# Patient Record
Sex: Male | Born: 2000
Health system: Southern US, Community
[De-identification: ages and names within clinical notes are randomized; demographics above are authoritative.]

## PROBLEM LIST (undated history)

## (undated) DIAGNOSIS — J45909 Unspecified asthma, uncomplicated: Secondary | ICD-10-CM

## (undated) HISTORY — PX: NASAL FRACTURE SURGERY: SHX718

## (undated) HISTORY — PX: WISDOM TOOTH EXTRACTION: SHX21

## (undated) HISTORY — PX: FRENULECTOMY, UPPER LABIAL: SHX1683

---
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
## 2018-08-06 DIAGNOSIS — M545 Low back pain: Secondary | ICD-10-CM | POA: Diagnosis not present

## 2018-08-10 DIAGNOSIS — M545 Low back pain: Secondary | ICD-10-CM | POA: Diagnosis not present

## 2019-09-11 ENCOUNTER — Ambulatory Visit: Payer: 59 | Attending: Internal Medicine

## 2019-09-11 DIAGNOSIS — Z20822 Contact with and (suspected) exposure to covid-19: Secondary | ICD-10-CM

## 2019-09-12 LAB — NOVEL CORONAVIRUS, NAA: SARS-CoV-2, NAA: NOT DETECTED

## 2019-10-01 ENCOUNTER — Ambulatory Visit: Payer: 59 | Attending: Internal Medicine

## 2019-10-01 DIAGNOSIS — Z20822 Contact with and (suspected) exposure to covid-19: Secondary | ICD-10-CM

## 2019-10-02 LAB — NOVEL CORONAVIRUS, NAA: SARS-CoV-2, NAA: NOT DETECTED

## 2020-04-12 DIAGNOSIS — Z20828 Contact with and (suspected) exposure to other viral communicable diseases: Secondary | ICD-10-CM | POA: Diagnosis not present

## 2020-04-19 ENCOUNTER — Encounter: Payer: Self-pay | Admitting: Emergency Medicine

## 2020-04-19 ENCOUNTER — Other Ambulatory Visit: Payer: Self-pay

## 2020-04-19 ENCOUNTER — Ambulatory Visit (INDEPENDENT_AMBULATORY_CARE_PROVIDER_SITE_OTHER): Payer: 59

## 2020-04-19 ENCOUNTER — Ambulatory Visit
Admission: EM | Admit: 2020-04-19 | Discharge: 2020-04-19 | Disposition: A | Discharge status: Home / Self Care | Payer: 59 | Attending: Family Medicine | Admitting: Family Medicine

## 2020-04-19 DIAGNOSIS — J1282 Pneumonia due to coronavirus disease 2019: Secondary | ICD-10-CM

## 2020-04-19 DIAGNOSIS — R509 Fever, unspecified: Secondary | ICD-10-CM | POA: Diagnosis not present

## 2020-04-19 DIAGNOSIS — U071 COVID-19: Secondary | ICD-10-CM

## 2020-04-19 DIAGNOSIS — J189 Pneumonia, unspecified organism: Secondary | ICD-10-CM | POA: Diagnosis not present

## 2020-04-19 MED ORDER — ALBUTEROL SULFATE HFA 108 (90 BASE) MCG/ACT IN AERS: 1.0000 | INHALATION_SPRAY | Freq: Four times a day (QID) | RESPIRATORY_TRACT | 0 refills | Status: DC | PRN

## 2020-04-19 MED ORDER — PREDNISONE 50 MG PO TABS: ORAL_TABLET | ORAL | 0 refills | Status: DC

## 2020-04-19 MED ORDER — BENZONATATE 200 MG PO CAPS: 200.0000 mg | ORAL_CAPSULE | Freq: Three times a day (TID) | ORAL | 0 refills | Status: DC | PRN

## 2020-04-19 NOTE — ED Triage Notes (Signed)
Pt tested positive for covid about a week ago. He states his fever has come back about 3 days ago, he can hear crackles in his chest. He also has pain in his chest. He has known asthma. He is requesting a chest xray.

## 2020-04-19 NOTE — Discharge Instructions (Signed)
Rest.  Check Pulse Ox daily.  Medication as prescribed.  Take care  Dr. Adriana Simas

## 2020-04-19 NOTE — ED Provider Notes (Signed)
[facility]    CSN: [id number] Arrival date & time: 04/19/20  1118      History   Chief Complaint Chief Complaint  Patient presents with  . COVID POSTITIVE  . Fever   HPI  19 year old male presents with the above complaints.  Patient reports that he tested positive for Covid 1 week ago.  He has been doing okay until the past 3 days.  Has had fever, T-max 100.7.  Has also been experiencing severe cough.  Keeping him up at night.  Patient states that he hears rattles in his chest.  He is concerned he has pneumonia.  Mother has been giving him albuterol nebulizer treatments without resolution.  Oxygen saturation normal at this time.    Past Medical History:  Diagnosis Date  . Asthma    as a child   Past Surgical History:  Procedure Laterality Date  . NASAL FRACTURE SURGERY    . ORIF ULNAR FRACTURE Right 06/21/2017   Procedure: OPEN REDUCTION INTERNAL FIXATION (ORIF) proximal ulna;  Surgeon: Bradly Bienenstock, MD;  Location: [facility] OR;  Service: Orthopedics;  Laterality: Right;  90 mins  . WISDOM TOOTH EXTRACTION     Home Medications    Prior to Admission medications   Medication Sig Start Date End Date Taking? Authorizing Provider  acetaminophen (TYLENOL) 500 MG tablet Take 500-1,000 mg by mouth every 6 (six) hours as needed for moderate pain.    [provider]  albuterol (VENTOLIN HFA) 108 (90 Base) MCG/ACT inhaler Inhale 1-2 puffs into the lungs every 6 (six) hours as needed for wheezing or shortness of breath. 04/19/20   Tommie Sams, DO  benzonatate (TESSALON) 200 MG capsule Take 1 capsule (200 mg total) by mouth 3 (three) times daily as needed for cough. 04/19/20   Tommie Sams, DO  predniSONE (DELTASONE) 50 MG tablet 1 tablet daily x 5 days 04/19/20   Tommie Sams, DO    Family History Family History  Problem Relation Age of Onset  . Sarcoidosis Father     Social History Social History   Tobacco Use  . Smoking status: Never Smoker  . Smokeless  tobacco: Never Used  Vaping Use  . Vaping Use: Former  Substance Use Topics  . Alcohol use: No  . Drug use: No     Allergies   Bee venom and Strawberry (diagnostic)   Review of Systems Review of Systems  Constitutional: Positive for fever.  Respiratory: Positive for cough.    Physical Exam Triage Vital Signs ED Triage Vitals  Enc Vitals Group     BP 04/19/20 1205 128/82     Pulse Rate 04/19/20 1205 (!) 104     Resp 04/19/20 1205 18     Temp 04/19/20 1205 99.1 F (37.3 C)     Temp Source 04/19/20 1205 Oral     SpO2 04/19/20 1205 98 %     Weight 04/19/20 1202 250 lb (113.4 kg)     Height 04/19/20 1202 6\' 3"  (1.905 m)     Head Circumference --      Peak Flow --      Pain Score 04/19/20 1202 4     Pain Loc --      Pain Edu? --      Excl. in GC? --    Updated Vital Signs BP 128/82 (BP Location: Right Arm)   Pulse (!) 104   Temp 99.1 F (37.3 C) (Oral)   Resp 18   Ht 6'  3" (1.905 m)   Wt 113.4 kg   SpO2 98%   BMI 31.25 kg/m   Visual Acuity Right Eye Distance:   Left Eye Distance:   Bilateral Distance:    Right Eye Near:   Left Eye Near:    Bilateral Near:     Physical Exam Vitals and nursing note reviewed.  Constitutional:      General: He is not in acute distress.    Appearance: Normal appearance. He is not ill-appearing.  HENT:     Head: Normocephalic and atraumatic.  Eyes:     General:        Right eye: No discharge.        Left eye: No discharge.     Conjunctiva/sclera: Conjunctivae normal.  Cardiovascular:     Rate and Rhythm: Regular rhythm. Tachycardia present.  Pulmonary:     Effort: Pulmonary effort is normal.     Comments: Crackles noted. Neurological:     Mental Status: He is alert.  Psychiatric:        Mood and Affect: Mood normal.        Behavior: Behavior normal.     UC Treatments / Results  Labs (all labs ordered are listed, but only abnormal results are displayed) Labs Reviewed - No data to  display  EKG   Radiology DG Chest 2 View  Result Date: 04/19/2020 CLINICAL DATA:  COVID-19 positive, fever EXAM: CHEST - 2 VIEW COMPARISON:  None. FINDINGS: Normal heart size. Normal mediastinal contour. No pneumothorax. No pleural effusion. Faint patchy hazy opacities in the mid lungs bilaterally. IMPRESSION: Faint patchy hazy opacities in the mid lungs bilaterally, compatible with COVID-19 pneumonia. Electronically Signed   By: Delbert Phenix M.D.   On: 04/19/2020 12:33    Procedures Procedures (including critical care time)  Medications Ordered in UC Medications - No data to display  Initial Impression / Assessment and Plan / UC Course  I have reviewed the triage vital signs and the nursing notes.  Pertinent labs & imaging results that were available during my care of the patient were reviewed by me and considered in my medical decision making (see chart for details).    19 year old male presents with fever and persistent cough.  COVID-19 positive.  Chest x-ray obtained today and independently reviewed by me.  Bilateral patchy opacities consistent with COVID-19 pneumonia.  Patient does not qualify for infusion.  No indications for antibiotic therapy as this is quite consistent with COVID-19 pneumonia as opposed to a secondary bacterial infection.  Placing on albuterol, prednisone.  Tessalon Perles for cough.  Final Clinical Impressions(s) / UC Diagnoses   Final diagnoses:  Pneumonia due to COVID-19 virus     Discharge Instructions     Rest.  Check Pulse Ox daily.  Medication as prescribed.  Take care  Dr. Adriana Simas    ED Prescriptions    Medication Sig Dispense Auth. Provider   albuterol (VENTOLIN HFA) 108 (90 Base) MCG/ACT inhaler Inhale 1-2 puffs into the lungs every 6 (six) hours as needed for wheezing or shortness of breath. 18 g Saladin Petrelli G, DO   predniSONE (DELTASONE) 50 MG tablet 1 tablet daily x 5 days 5 tablet Namrata Dangler G, DO   benzonatate (TESSALON) 200 MG  capsule Take 1 capsule (200 mg total) by mouth 3 (three) times daily as needed for cough. 30 capsule Tommie Sams, DO     PDMP not reviewed this encounter.   Tommie Sams, [place] 04/19/20 1326

## 2021-06-19 IMAGING — CR DG CHEST 2V
3 series · 3 of 3 positions shown · non-contrast
Comparison: None.

CLINICAL DATA: PXCDB-T6 positive, fever

EXAM:
CHEST - 2 VIEW

[chest pa]
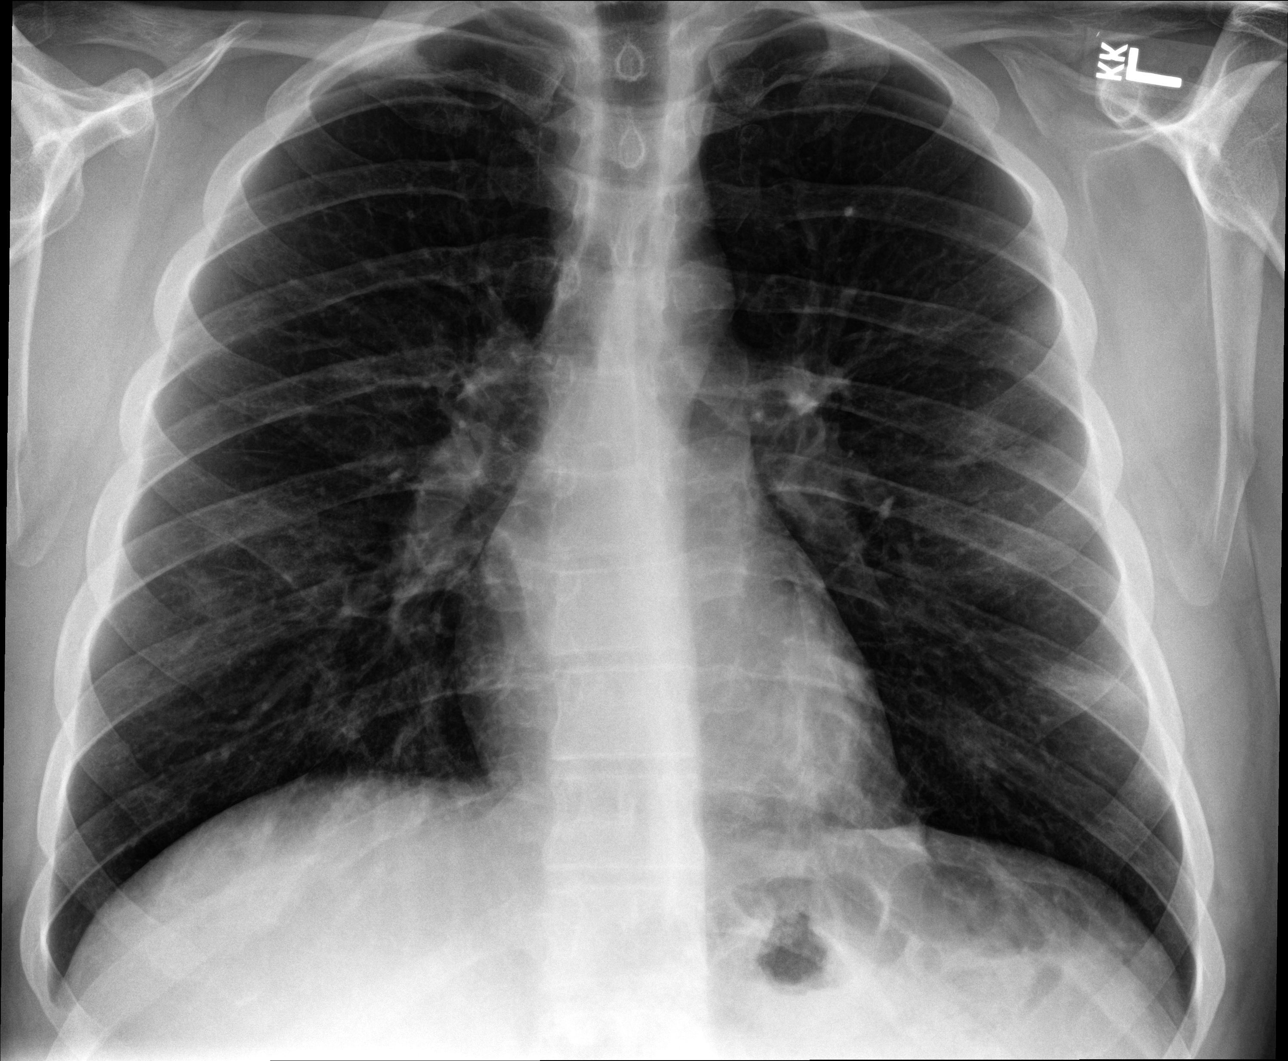

[chest lat (1 of 2)]
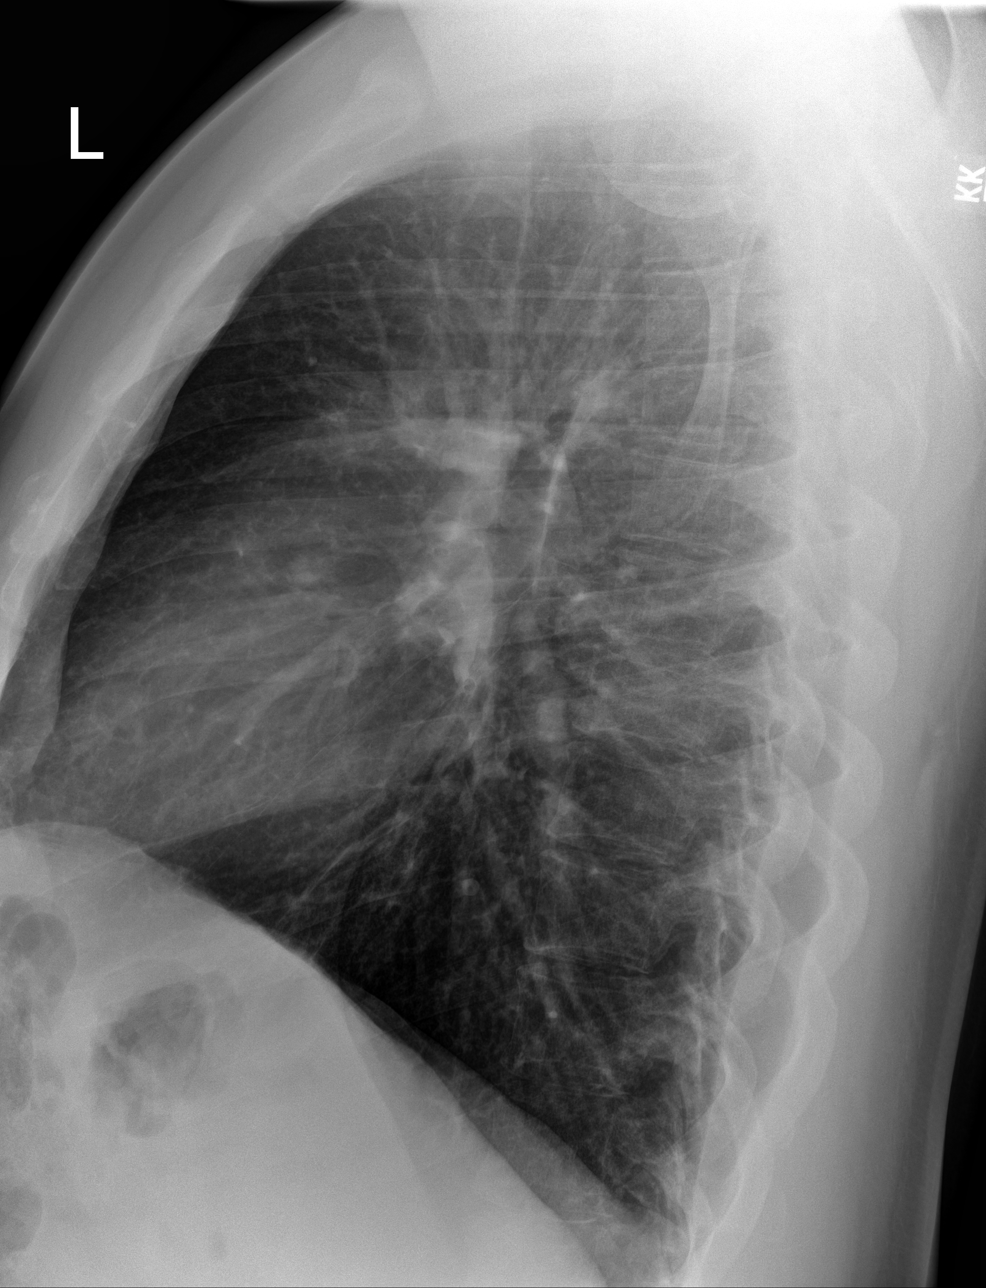

[chest lat (2 of 2)]
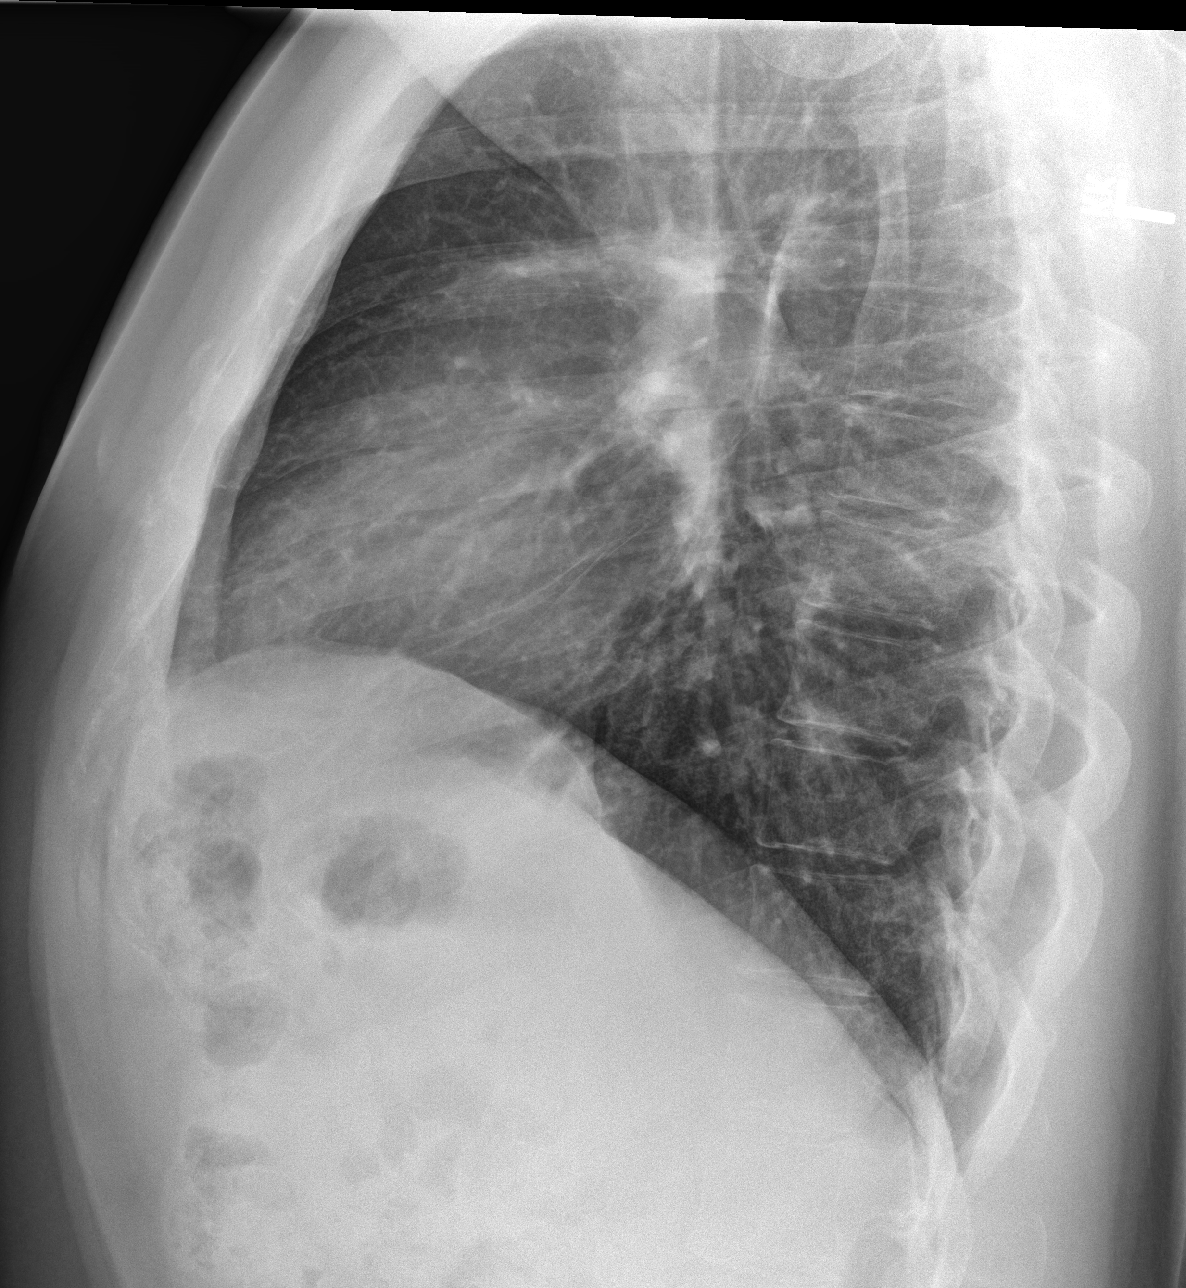

[3 of 3 positions shown; findings below may reference images not displayed]

FINDINGS: Normal heart size. Normal mediastinal contour. No pneumothorax. No
pleural effusion. Faint patchy hazy opacities in the mid lungs
bilaterally.
IMPRESSION: Faint patchy hazy opacities in the mid lungs bilaterally, compatible
with PXCDB-T6 pneumonia.

## 2022-03-07 ENCOUNTER — Other Ambulatory Visit (HOSPITAL_COMMUNITY): Payer: Self-pay

## 2022-03-07 ENCOUNTER — Telehealth: Payer: 59 | Admitting: Physician Assistant

## 2022-03-07 DIAGNOSIS — B9689 Other specified bacterial agents as the cause of diseases classified elsewhere: Secondary | ICD-10-CM

## 2022-03-07 DIAGNOSIS — B9789 Other viral agents as the cause of diseases classified elsewhere: Secondary | ICD-10-CM

## 2022-03-07 DIAGNOSIS — J208 Acute bronchitis due to other specified organisms: Secondary | ICD-10-CM

## 2022-03-07 MED ORDER — PSEUDOEPH-BROMPHEN-DM 30-2-10 MG/5ML PO SYRP
5.0000 mL | ORAL_SOLUTION | Freq: Four times a day (QID) | ORAL | 0 refills | Status: DC | PRN
  Filled 2022-03-07: qty 120, 6d supply, fill #0

## 2022-03-07 MED ORDER — IPRATROPIUM BROMIDE 0.03 % NA SOLN
2.0000 | Freq: Two times a day (BID) | NASAL | 0 refills | Status: DC
  Filled 2022-03-07: qty 30, 30d supply, fill #0

## 2022-03-07 NOTE — Progress Notes (Signed)
E-Visit for Sinus Problems  We are sorry that you are not feeling well.  Here is how we plan to help!  Based on what you have shared with me it looks like you have sinusitis.  Sinusitis is inflammation and infection in the sinus cavities of the head.  Based on your presentation I believe you most likely have Acute Viral Sinusitis.This is an infection most likely caused by a virus. There is not specific treatment for viral sinusitis other than to help you with the symptoms until the infection runs its course.  You may use an oral decongestant such as Mucinex D or if you have glaucoma or high blood pressure use plain Mucinex. Saline nasal spray help and can safely be used as often as needed for congestion, I have prescribed: Ipratropium Bromide nasal spray 0.03% 2 sprays in eah nostril 2-3 times a day. I will prescribe Bromfed DM cough syrup for the cough.   Some authorities believe that zinc sprays or the use of Echinacea may shorten the course of your symptoms.  Sinus infections are not as easily transmitted as other respiratory infection, however we still recommend that you avoid close contact with loved ones, especially the very young and elderly.  Remember to wash your hands thoroughly throughout the day as this is the number one way to prevent the spread of infection!  Home Care: Only take medications as instructed by your medical team. Do not take these medications with alcohol. A steam or ultrasonic humidifier can help congestion.  You can place a towel over your head and breathe in the steam from hot water coming from a faucet. Avoid close contacts especially the very young and the elderly. Cover your mouth when you cough or sneeze. Always remember to wash your hands.  Get Help Right Away If: You develop worsening fever or sinus pain. You develop a severe head ache or visual changes. Your symptoms persist after you have completed your treatment plan.  Make sure you Understand these  instructions. Will watch your condition. Will get help right away if you are not doing well or get worse.   Thank you for choosing an e-visit.  Your e-visit answers were reviewed by a board certified advanced clinical practitioner to complete your personal care plan. Depending upon the condition, your plan could have included both over the counter or prescription medications.  Please review your pharmacy choice. Make sure the pharmacy is open so you can pick up prescription now. If there is a problem, you may contact your provider through [employer] and have the prescription routed to another pharmacy.  Your safety is important to [place]. If you have drug allergies check your prescription carefully.   For the next 24 hours you can use MyChart to ask questions about today's visit, request a non-urgent call back, or ask for a work or school excuse. You will get an email in the next two days asking about your experience. I hope that your e-visit has been valuable and will speed your recovery.  I provided 5 minutes of non face-to-face time during this encounter for chart review and documentation.

## 2022-03-09 ENCOUNTER — Other Ambulatory Visit (HOSPITAL_COMMUNITY): Payer: Self-pay

## 2022-03-09 MED ORDER — AZITHROMYCIN 250 MG PO TABS
ORAL_TABLET | ORAL | 0 refills | Status: AC
  Filled 2022-03-09: qty 6, 5d supply, fill #0

## 2022-03-09 MED ORDER — BENZONATATE 100 MG PO CAPS
100.0000 mg | ORAL_CAPSULE | Freq: Three times a day (TID) | ORAL | 0 refills | Status: DC | PRN
  Filled 2022-03-09: qty 30, 10d supply, fill #0

## 2022-03-09 NOTE — Addendum Note (Signed)
Addended by: Margaretann Loveless on: 03/09/2022 07:35 AM   Modules accepted: Orders

## 2022-03-09 NOTE — Progress Notes (Signed)
We are sorry that you are not feeling well.  Here is how we plan to help!  Based on your presentation I believe you most likely have A cough due to bacteria.  When patients have a fever and a productive cough with a change in color or increased sputum production, we are concerned about bacterial bronchitis.  If left untreated it can progress to pneumonia.  If your symptoms do not improve with your treatment plan it is important that you contact your provider.   I have prescribed Azithromyin 250 mg: two tablets now and then one tablet daily for 4 additonal days    In addition you may use A non-prescription cough medication called Mucinex DM: take 2 tablets every 12 hours. and A prescription cough medication called Tessalon Perles 100mg. You may take 1-2 capsules every 8 hours as needed for your cough.   From your responses in the eVisit questionnaire you describe inflammation in the upper respiratory tract which is causing a significant cough.  This is commonly called Bronchitis and has four common causes:   Allergies Viral Infections Acid Reflux Bacterial Infection Allergies, viruses and acid reflux are treated by controlling symptoms or eliminating the cause. An example might be a cough caused by taking certain blood pressure medications. You stop the cough by changing the medication. Another example might be a cough caused by acid reflux. Controlling the reflux helps control the cough.  USE OF BRONCHODILATOR ("RESCUE") INHALERS: There is a risk from using your bronchodilator too frequently.  The risk is that over-reliance on a medication which only relaxes the muscles surrounding the breathing tubes can reduce the effectiveness of medications prescribed to reduce swelling and congestion of the tubes themselves.  Although you feel brief relief from the bronchodilator inhaler, your asthma may actually be worsening with the tubes becoming more swollen and filled with mucus.  This can delay other  crucial treatments, such as oral steroid medications. If you need to use a bronchodilator inhaler daily, several times per day, you should discuss this with your provider.  There are probably better treatments that could be used to keep your asthma under control.     HOME CARE Only take medications as instructed by your medical team. Complete the entire course of an antibiotic. Drink plenty of fluids and get plenty of rest. Avoid close contacts especially the very young and the elderly Cover your mouth if you cough or cough into your sleeve. Always remember to wash your hands A steam or ultrasonic humidifier can help congestion.   GET HELP RIGHT AWAY IF: You develop worsening fever. You become short of breath You cough up blood. Your symptoms persist after you have completed your treatment plan MAKE SURE YOU  Understand these instructions. Will watch your condition. Will get help right away if you are not doing well or get worse.    Thank you for choosing an e-visit.  Your e-visit answers were reviewed by a board certified advanced clinical practitioner to complete your personal care plan. Depending upon the condition, your plan could have included both over the counter or prescription medications.  Please review your pharmacy choice. Make sure the pharmacy is open so you can pick up prescription now. If there is a problem, you may contact your provider through MyChart messaging and have the prescription routed to another pharmacy.  Your safety is important to us. If you have drug allergies check your prescription carefully.   For the next 24 hours you can use   MyChart to ask questions about today's visit, request a non-urgent call back, or ask for a work or school excuse. You will get an email in the next two days asking about your experience. I hope that your e-visit has been valuable and will speed your recovery.  I provided 5 minutes of non face-to-face time during this encounter  for chart review and documentation.   

## 2022-04-07 DIAGNOSIS — L723 Sebaceous cyst: Secondary | ICD-10-CM | POA: Diagnosis not present

## 2023-01-25 DIAGNOSIS — J029 Acute pharyngitis, unspecified: Secondary | ICD-10-CM | POA: Diagnosis not present

## 2023-01-25 DIAGNOSIS — J069 Acute upper respiratory infection, unspecified: Secondary | ICD-10-CM | POA: Diagnosis not present

## 2023-05-31 DIAGNOSIS — L7211 Pilar cyst: Secondary | ICD-10-CM | POA: Diagnosis not present

## 2023-05-31 DIAGNOSIS — L0211 Cutaneous abscess of neck: Secondary | ICD-10-CM | POA: Diagnosis not present

## 2023-09-05 ENCOUNTER — Ambulatory Visit: Payer: BC Managed Care – PPO | Admitting: Family Medicine

## 2023-09-05 ENCOUNTER — Encounter: Payer: Self-pay | Admitting: Family Medicine

## 2023-09-05 VITALS — BP 130/76 | HR 108 | Temp 98.6°F | Ht 75.0 in | Wt 269.8 lb

## 2023-09-05 DIAGNOSIS — Z0000 Encounter for general adult medical examination without abnormal findings: Secondary | ICD-10-CM

## 2023-09-05 DIAGNOSIS — Z23 Encounter for immunization: Secondary | ICD-10-CM | POA: Diagnosis not present

## 2023-09-05 DIAGNOSIS — Z13220 Encounter for screening for lipoid disorders: Secondary | ICD-10-CM | POA: Diagnosis not present

## 2023-09-05 NOTE — Progress Notes (Signed)
 [facility] [address] [place] [address] Dept: [phone] Dept Fax: [phone]  New Patient Office Visit  Subjective:    Patient ID: Glenn Martinez, male    DOB: 13-Feb-2001, 23 y.o..   MRN: [id number]  Chief Complaint  Patient presents with   Establish Care    NP- establish care.  No concerns.     History of Present Illness:  Patient is in today to establish care.Glenn Martinez was born in [place]. 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 Glenn Martinez is single and has no children. He denies tobacco or drug use. He only occasionally drinks alcohol.  Review of Systems  Constitutional:  Negative for chills, diaphoresis, fever, malaise/fatigue and weight loss.  HENT:  Negative for congestion, ear pain, hearing loss, sinus pain, sore throat and tinnitus.   Eyes:  Negative for blurred vision, pain, discharge and redness.  Respiratory:  Negative for cough, shortness of breath and wheezing.   Cardiovascular:  Negative for chest pain and palpitations.  Gastrointestinal:  Negative for abdominal pain, constipation, diarrhea, heartburn, nausea and vomiting.  Musculoskeletal:  Negative for back pain, joint pain and myalgias.  Skin:  Negative for itching and rash.  Psychiatric/Behavioral:  Negative for depression. The patient is not nervous/anxious.    Past Medical History: There are no active problems to display for this patient.  Past Surgical History:  Procedure Laterality Date   FRENULECTOMY, UPPER LABIAL     NASAL FRACTURE SURGERY     ORIF ULNAR FRACTURE Right 06/21/2017   Procedure: OPEN REDUCTION INTERNAL FIXATION (ORIF) proximal ulna;  Surgeon: Shari Easter, MD;  Location: [facility] OR;  Service: Orthopedics;  Laterality: Right;  90 mins   WISDOM TOOTH EXTRACTION     Family History  Problem Relation Age of Onset   Asthma Mother     Sarcoidosis Father        Neural   Stroke Paternal Uncle    Diabetes Paternal Uncle    Cancer Paternal Grandmother        Ovarian   Cancer Paternal Grandfather        Leukemia   Outpatient Medications Prior to Visit  Medication Sig Dispense Refill   acetaminophen (TYLENOL) 500 MG tablet Take 500-1,000 mg by mouth every 6 (six) hours as needed for moderate pain.     albuterol  (VENTOLIN  HFA) 108 (90 Base) MCG/ACT inhaler Inhale 1-2 puffs into the lungs every 6 (six) hours as needed for wheezing or shortness of breath. 18 g 0   benzonatate  (TESSALON ) 100 MG capsule Take 1 capsule (100 mg total) by mouth 3 (three) times daily as needed. 30 capsule 0   brompheniramine-pseudoephedrine-DM 30-2-10 MG/5ML syrup Take 5 mLs by mouth 4 (four) times daily as needed. 120 mL 0   ipratropium (ATROVENT ) 0.03 % nasal spray Place 2 sprays into both nostrils every 12 (twelve) hours. 30 mL 0   predniSONE  (DELTASONE ) 50 MG tablet 1 tablet daily x 5 days 5 tablet 0   No facility-administered medications prior to visit.   Allergies  Allergen Reactions   Bee Venom Hives and Swelling    SWELLING REACTION UNSPECIFIED    Strawberry (Diagnostic) Hives and Itching   Doxycycline    Objective:   Today's Vitals   09/05/23 1426  BP: 130/76  Pulse: (!) 108  Temp: 98.6 F (37 C)  TempSrc: Temporal  SpO2: 99%  Weight: 269 lb  12.8 oz (122.4 kg)  Height: 6' 3 (1.905 m)   Body mass index is 33.72 kg/m.   General: Well developed, well nourished. No acute distress. HEENT: Normocephalic, non-traumatic. PERRL, EOMI. Conjunctiva clear. External ears normal. EAC and TMs   normal bilaterally. Nose clear without congestion or rhinorrhea. Mucous membranes moist. Oropharynx clear. Good   dentition. Neck: Supple. No lymphadenopathy. No thyromegaly. Lungs: Clear to auscultation bilaterally. No wheezing, rales or rhonchi. CV: RRR without murmurs or rubs. Pulses 2+ bilaterally. Abdomen: Soft, non-tender. Bowel sounds  positive, normal pitch and frequency. No hepatosplenomegaly. No   rebound or guarding. Extremities: Full ROM. No joint swelling or tenderness. No edema noted. Skin: Warm and dry. No rashes. Psych: Alert and oriented. Normal mood and affect.  Health Maintenance Due  Topic Date Due   HPV VACCINES (1 - Male 3-dose series) Never done   HIV Screening  Never done   Hepatitis C Screening  Never done   DTaP/Tdap/Td (8 - Td or Tdap) 02/18/2020   INFLUENZA VACCINE  Never done   Assessment & Plan:   Problem List Items Addressed This Visit   None Visit Diagnoses       Annual physical exam    -  Primary   Overall good health. I recommend he increase his weekly cardio and work at weight loss. Reviewed indicated screenings and immunizations.     Screening for lipid disorders       Relevant Orders   Lipid panel     Need for Td vaccine       Relevant Orders   Td vaccine greater than or equal to 7yo preservative free IM (Completed)     Need for immunization against influenza       Relevant Orders   Flu vaccine trivalent PF, 6mos and older(Flulaval,Afluria,Fluarix,Fluzone) (Completed)       Return in about 1 year (around 09/04/2024) for Annual preventative care.   Garnette CHRISTELLA Simpler, MD

## 2023-09-06 LAB — LIPID PANEL
Cholesterol: 147 mg/dL (ref 0–200)
HDL: 46.2 mg/dL (ref >39.00)
LDL Cholesterol: 92 mg/dL (ref 0–99)
NonHDL: 100.45
Total CHOL/HDL Ratio: 3
Triglycerides: 43 mg/dL (ref 0.0–149.0)
VLDL: 8.6 mg/dL (ref 0.0–40.0)

## 2023-10-04 ENCOUNTER — Ambulatory Visit: Payer: Self-pay | Admitting: Family Medicine

## 2023-10-04 NOTE — Telephone Encounter (Signed)
 Copied from CRM [id number]. Topic: Clinical - Red Word Triage >> Oct 04, 2023  1:37 PM Leila C wrote: Red Word that prompted transfer to Nurse Triage: Patient [id number] last night reached a wrong way, lower back pain, hurts when walking, sitting or moving. Feels stiff, taken Ibuprofen, Tylenol not helping. Patient denies any other symptoms.   Chief Complaint: Back pain Symptoms: Lower back pain Frequency: Constant  Pertinent Negatives: Patient denies numbness, tingling, loss of bowel or bladder control  Disposition: [] ED /[x] Urgent Care (no appt availability in office) / [] Appointment(In office/virtual)/ []  [facility]/ [] Home Care/ [] Refused Recommended Disposition /[] [facility]/ []  Follow-up with PCP Additional Notes: Patient reports that while reaching for a washcloth in his bathroom yesterday he began to experience lower back pain. He denies any known injury to the back and states he hasn't had any heavy lifting or strenuous work with his back recently. He reports he has been taking Tylenol, Ibuprofen, and using lidocaine  patches without relief. Patient advised there are no appointments available in the office and he states he will go to a walk in clinic when he gets off work. Patient advised to call back for new or worsening symptoms. Patient verbalized understanding and agreement of this plan.     Reason for Disposition  [1] SEVERE back pain (e.g., excruciating, unable to do any normal activities) AND [2] not improved 2 hours after pain medicine  Answer Assessment - Initial Assessment Questions 1. ONSET: When did the pain begin?      Last night  2. LOCATION: Where does it hurt? (upper, mid or lower back)     Lower back  3. SEVERITY: How bad is the pain?  (e.g., Scale 1-10; mild, moderate, or severe)   - MILD (1-3): Doesn't interfere with normal activities.    - MODERATE (4-7): Interferes with normal activities or awakens from sleep.    - SEVERE (8-10):  Excruciating pain, unable to do any normal activities.      Moderate to severe  4. PATTERN: Is the pain constant? (e.g., yes, no; constant, intermittent)      Constant  5. RADIATION: Does the pain shoot into your legs or somewhere else?     No radiation  6. CAUSE:  What do you think is causing the back pain?      Patient was reaching for a washcloth in the bathroom and began to experience pan 7. BACK OVERUSE:  Any recent lifting of heavy objects, strenuous work or exercise?     No 8. MEDICINES: What have you taken so far for the pain? (e.g., nothing, acetaminophen, NSAIDS)     Ibuprofen, Tylenol, and lidocaine  patches without relief  9. NEUROLOGIC SYMPTOMS: Do you have any weakness, numbness, or problems with bowel/bladder control?     No 10. OTHER SYMPTOMS: Do you have any other symptoms? (e.g., fever, abdomen pain, burning with urination, blood in urine)       No  Protocols used: Back Pain-A-AH

## 2023-11-24 ENCOUNTER — Ambulatory Visit: Admitting: Family Medicine

## 2023-11-24 VITALS — BP 116/74 | HR 97 | Temp 99.4°F | Wt 276.6 lb

## 2023-11-24 DIAGNOSIS — J4521 Mild intermittent asthma with (acute) exacerbation: Secondary | ICD-10-CM

## 2023-11-24 DIAGNOSIS — U071 COVID-19: Secondary | ICD-10-CM

## 2023-11-24 LAB — POC COVID19 BINAXNOW: SARS Coronavirus 2 Ag: POSITIVE — AB

## 2023-11-24 LAB — POCT INFLUENZA A/B
Influenza A, POC: NEGATIVE
Influenza B, POC: NEGATIVE

## 2023-11-24 MED ORDER — ALBUTEROL SULFATE HFA 108 (90 BASE) MCG/ACT IN AERS: 2.0000 | INHALATION_SPRAY | Freq: Four times a day (QID) | RESPIRATORY_TRACT | 0 refills | Status: AC | PRN

## 2023-11-24 NOTE — Progress Notes (Signed)
 Assessment/Plan:    Assessment & Plan COVID-19 infection Glenn Martinez presents with symptoms consistent with COVID-19, including postnasal drip, increased phlegm, fever, and nasal congestion. Symptoms began two days ago, worsening last night with a fever of 101.21F. He has asthma typically triggered post-viral illness. Currently, there is no chest pain or shortness of breath, and oxygen saturation is 99%. Given his age and symptoms, he is not at high risk for severe disease or hospitalization. He received two doses of the COVID-19 vaccine, the last in 2021. Antiviral treatment is not recommended. COVID-19 is generally less severe than in previous years, with most cases resolving within five to ten days. - Prescribe albuterol inhaler for potential reactive airway symptoms. - Recommend over-the-counter Flonase for nasal congestion. - Advise use of guaifenesin (Mucinex) and dextromethorphan (Robitussin) for cough management. - Instruct to use ibuprofen (400 mg every 4 hours with food) and acetaminophen (1000 mg every 6 hours) for fever and pain management, ensuring not to exceed 2400 mg of ibuprofen and 4000 mg of acetaminophen in 24 hours. - Advise to maintain hydration and rest. - Instruct to monitor for chest pain and shortness of breath. - Advise to quarantine until fever-free for 24 hours and symptoms improve, then wear a mask around others for a few days.  Asthma Glenn Martinez's asthma is typically triggered post-viral illness. He does not currently have an albuterol inhaler as his previous one expired. He reports no current asthma symptoms during physical activities unless he is sick. The albuterol inhaler is prescribed to manage potential reactive airway symptoms triggered by the viral infection. - Prescribe albuterol inhaler for use as needed for asthma symptoms.      There are no discontinued medications.  No follow-ups on file.    Subjective:   Encounter date: 11/24/2023  Glenn Martinez is a  23 y.o. male who does not have a problem list on file.Marland Kitchen   He  has a past medical history of Asthma.Marland Kitchen   He presents with chief complaint of Cough (Phlegm, fatigue, tingling and sore skin,fever, body aches, chills x Wednesday. Tx with claritin , sudafed, ibuprofen.) .   Discussed the use of AI scribe software for clinical note transcription with the patient, who gave verbal consent to proceed.  History of Present Illness Glenn Martinez is a 23 year old male with asthma who presents with COVID-19 symptoms.  Symptoms began with a post-nasal drip two days ago, initially thought to be allergies, for which he took Claritin and Sudafed. Last night, symptoms worsened with increased phlegm and a fever of 100.21F, rising to 101.21F despite taking Motrin. He has significant nasal drainage and frequently needs to blow his nose. Post-nasal drip is more pronounced when lying down, but there is no significant cough. No chest pain or shortness of breath, though he noticed brief chest discomfort yesterday.  He has a history of asthma, which typically exacerbates after viral illnesses. He recalls developing COVID pneumonia during his first COVID-19 infection. He does not currently have an albuterol inhaler as his previous one expired. He engages in physical activities like softball and kickball without asthma issues unless he is sick.  He received the initial two doses of the COVID vaccine in 2021 but has not had any boosters since then. He is currently taking Claritin, Sudafed, and Motrin for his symptoms.  His mother experiences similar respiratory issues where illnesses settle in her chest.      Past Surgical History:  Procedure Laterality Date   FRENULECTOMY, UPPER LABIAL  NASAL FRACTURE SURGERY     ORIF ULNAR FRACTURE Right 06/21/2017   Procedure: OPEN REDUCTION INTERNAL FIXATION (ORIF) proximal ulna;  Surgeon: Bradly Bienenstock, MD;  Location: [facility] OR;  Service: Orthopedics;  Laterality: Right;  90 mins    WISDOM TOOTH EXTRACTION      Outpatient Medications Prior to Visit  Medication Sig Dispense Refill   acetaminophen (TYLENOL) 500 MG tablet Take 500-1,000 mg by mouth every 6 (six) hours as needed for moderate pain. (Patient not taking: Reported on 11/24/2023)     No facility-administered medications prior to visit.    Family History  Problem Relation Age of Onset   Asthma Mother    Sarcoidosis Father        Neural   Stroke Paternal Uncle    Diabetes Paternal Uncle    Cancer Paternal Grandmother        Ovarian   Cancer Paternal Grandfather        Leukemia    Social History   Socioeconomic History   Marital status: Single    Spouse name: Not on file   Number of children: 0   Years of education: Not on file   Highest education level: Bachelor's degree (e.g., BA, AB, BS)  Occupational History   Occupation: Psychologist, educational    Comment: [employer]  Tobacco Use   Smoking status: Never   Smokeless tobacco: Never  Vaping Use   Vaping status: Former  Substance and Sexual Activity   Alcohol use: No   Drug use: No   Sexual activity: Not on file  Other Topics Concern   Not on file  Social History Narrative   Not on file   Social Drivers of Health   Financial Resource Strain: Not on file  Food Insecurity: Not on file  Transportation Needs: Not on file  Physical Activity: Not on file  Stress: Not on file  Social Connections: Not on file  Intimate Partner Violence: Not on file                                                                                                  Objective:  Physical Exam: BP 116/74   Pulse 97   Temp 99.4 F (37.4 C) (Oral)   Wt 276 lb 9.6 oz (125.5 kg)   SpO2 99%   BMI 34.57 kg/m     Physical Exam Constitutional:      Appearance: Normal appearance.  HENT:     Head: Normocephalic and atraumatic.     Right Ear: Hearing normal.     Left Ear: Hearing normal.     Nose: Nose normal.  Eyes:     General: No scleral icterus.        Right eye: No discharge.        Left eye: No discharge.     Extraocular Movements: Extraocular movements intact.  Cardiovascular:     Rate and Rhythm: Normal rate and regular rhythm.     Heart sounds: Normal heart sounds.  Pulmonary:     Effort: Pulmonary effort is normal.     Breath sounds: Normal breath  sounds.  Abdominal:     Palpations: Abdomen is soft.     Tenderness: There is no abdominal tenderness.  Skin:    General: Skin is warm.     Findings: No rash.  Neurological:     General: No focal deficit present.     Mental Status: He is alert.     Cranial Nerves: No cranial nerve deficit.  Psychiatric:        Mood and Affect: Mood normal.        Behavior: Behavior normal.        Thought Content: Thought content normal.        Judgment: Judgment normal.     No results found.  Recent Results (from the past 2160 hours)  Lipid panel     Status: None   Collection Time: 09/05/23  3:14 PM  Result Value Ref Range   Cholesterol 147 0 - 200 mg/dL    Comment: ATP III Classification       Desirable:  < 200 mg/dL               Borderline High:  200 - 239 mg/dL          High:  > = 865 mg/dL   Triglycerides 78.4 0.0 - 149.0 mg/dL    Comment: Normal:  <696 mg/dLBorderline High:  150 - 199 mg/dL   HDL 29.52 >84.13 mg/dL   VLDL 8.6 0.0 - 24.4 mg/dL   LDL Cholesterol 92 0 - 99 mg/dL   Total CHOL/HDL Ratio 3     Comment:                Men          Women1/2 Average Risk     3.4          3.3Average Risk          5.0          4.42X Average Risk          9.6          7.13X Average Risk          15.0          11.0                       NonHDL 100.45     Comment: NOTE:  Non-HDL goal should be 30 mg/dL higher than patient's LDL goal (i.e. LDL goal of < 70 mg/dL, would have non-HDL goal of < 100 mg/dL)  POC [id number] BinaxNow     Status: Abnormal   Collection Time: 11/24/23  4:12 PM  Result Value Ref Range   SARS Coronavirus 2 Ag Positive (A) Negative  POCT Influenza A/B     Status: None    Collection Time: 11/24/23  4:12 PM  Result Value Ref Range   Influenza A, POC Negative Negative   Influenza B, POC Negative Negative        Garner Nash, MD, MS

## 2023-11-24 NOTE — Patient Instructions (Signed)
 VISIT SUMMARY:  Today, you were seen for symptoms consistent with COVID-19, including postnasal drip, increased phlegm, fever, and nasal congestion. You have a history of asthma, which can worsen after viral illnesses. Your symptoms began two days ago and worsened last night. You do not currently have chest pain or shortness of breath, and your oxygen levels are good. You received two doses of the COVID-19 vaccine in 2021 but have not had any boosters since then.  YOUR PLAN:  -COVID-19 INFECTION: COVID-19 is a viral infection that can cause symptoms like fever, cough, and congestion. You are advised to use an albuterol inhaler for potential breathing issues, over-the-counter Flonase for nasal congestion, and guaifenesin (Mucinex) and dextromethorphan (Robitussin) for cough. For fever and pain, take ibuprofen (400 mg every 4 hours with food) and acetaminophen (1000 mg every 6 hours), but do not exceed 2400 mg of ibuprofen and 4000 mg of acetaminophen in 24 hours. Stay hydrated, rest, and monitor for chest pain and shortness of breath. Quarantine until you are fever-free for 24 hours and your symptoms improve, then wear a mask around others for a few days.  -ASTHMA: Asthma is a condition where your airways can become inflamed and narrow, making it hard to breathe. It can be triggered by viral infections. You are prescribed an albuterol inhaler 2 puffs every 4-6 hours to use as needed for asthma symptoms.  INSTRUCTIONS:  Please follow the medication instructions provided and monitor your symptoms closely. If you experience chest pain or shortness of breath, seek medical attention immediately. Quarantine until you are fever-free for 24 hours and your symptoms improve, then wear a mask around others for a few days.

## 2023-11-28 ENCOUNTER — Ambulatory Visit: Payer: Self-pay | Admitting: *Deleted

## 2023-11-28 ENCOUNTER — Other Ambulatory Visit: Payer: Self-pay | Admitting: Family Medicine

## 2023-11-28 DIAGNOSIS — U071 COVID-19: Secondary | ICD-10-CM

## 2023-11-28 MED ORDER — PROMETHAZINE-DM 6.25-15 MG/5ML PO SYRP: 5.0000 mL | ORAL_SOLUTION | Freq: Four times a day (QID) | ORAL | 0 refills | Status: DC | PRN

## 2023-11-28 NOTE — Telephone Encounter (Signed)
 Copied from CRM [id number]. Topic: Clinical - Medication Question >> Nov 28, 2023  8:13 AM Gurney Maxin H wrote: Reason for CRM: Patient tested positive for COVID on 3/28, feeling better but now has a dry cough, not producing any mucus but just starting to keep him up at night. Patient would like to know if there could be some cough syrup or something to help him with the cough [facility], thanks.  Audie [phone] Reason for Disposition  [1] Continuous (nonstop) coughing interferes with work or school AND [2] no improvement using cough treatment per Care Advice  Answer Assessment - Initial Assessment Questions 1. ONSET: "When did the cough begin?"      My Covid is much better but I have the dry hacking cough.   It keeps me up at night.  I have not tried Robitussin.    2. SEVERITY: "How bad is the cough today?"      A dry hacking cough.  I was up every 20-30 min. Last night.   I would really like for him to call me in something stronger than OTC.  3. SPUTUM: "Describe the color of your sputum" (none, dry cough; clear, white, yellow, green)     Not coughing everything.   4. HEMOPTYSIS: "Are you coughing up any blood?" If so ask: "How much?" (flecks, streaks, tablespoons, etc.)     Not asked 5. DIFFICULTY BREATHING: "Are you having difficulty breathing?" If Yes, ask: "How bad is it?" (e.g., mild, moderate, severe)    - MILD: No SOB at rest, mild SOB with walking, speaks normally in sentences, can lie down, no retractions, pulse < 100.    - MODERATE: SOB at rest, SOB with minimal exertion and prefers to sit, cannot lie down flat, speaks in phrases, mild retractions, audible wheezing, pulse 100-120.    - SEVERE: Very SOB at rest, speaks in single words, struggling to breathe, sitting hunched forward, retractions, pulse > 120      No shortness of breath, chest tightness or wheezing 6. FEVER: "Do you have a fever?" If Yes, ask: "What is your temperature, how was it measured, and  when did it start?"     No 7. CARDIAC HISTORY: "Do you have any history of heart disease?" (e.g., heart attack, congestive heart failure)      Not asked 8. LUNG HISTORY: "Do you have any history of lung disease?"  (e.g., pulmonary embolus, asthma, emphysema)     I've had covid 3 times 9. PE RISK FACTORS: "Do you have a history of blood clots?" (or: recent major surgery, recent prolonged travel, bedridden)     Not asked 10. OTHER SYMPTOMS: "Do you have any other symptoms?" (e.g., runny nose, wheezing, chest pain)       My other symptoms  11. PREGNANCY: "Is there any chance you are pregnant?" "When was your last menstrual period?"       N/A 12. TRAVEL: "Have you traveled out of the country in the last month?" (e.g., travel history, exposures)       N/A  Protocols used: Cough - Acute Non-Productive-A-AH  Chief Complaint: Dry hacking cough that is keeping him up at night.   Seen by Dr Janee Morn on 11/24/2023 diagnosed with Covid.  All other symptoms have resolved but the dry hacking cough Symptoms: Last night the dry hacking cough kept him up every 20-30 min.   [facility]ing [facility] [place]   "  I've had Covid 3 times and had pneumonia with it".   "I don't want that to happen again".   Frequency: Last night the cough was worse Pertinent Negatives: Patient denies coughing up anything or having other symptoms. Disposition: [] ED /[] Urgent Care (no appt availability in office) / [] Appointment(In office/virtual)/ []  [place] [facility]/ [] Home Care/ [] Refused Recommended Disposition /[] [place] [facility]/ [x]  Follow-up with PCP Additional Notes: Message sent to Dr. Fanny Bien since that is who pt saw on 11/24/2023.    Requesting cough medication be called in.    Pt. Agreeable to someone calling him back with provider's decision.

## 2023-11-28 NOTE — Addendum Note (Signed)
 Addended by: Fanny Bien B on: 11/28/2023 04:21 PM   Modules accepted: Orders

## 2024-02-14 DIAGNOSIS — L858 Other specified epidermal thickening: Secondary | ICD-10-CM | POA: Diagnosis not present

## 2024-02-14 DIAGNOSIS — L738 Other specified follicular disorders: Secondary | ICD-10-CM | POA: Diagnosis not present

## 2024-02-14 DIAGNOSIS — L0889 Other specified local infections of the skin and subcutaneous tissue: Secondary | ICD-10-CM | POA: Diagnosis not present

## 2024-05-14 ENCOUNTER — Ambulatory Visit: Payer: Self-pay

## 2024-05-14 ENCOUNTER — Ambulatory Visit (HOSPITAL_BASED_OUTPATIENT_CLINIC_OR_DEPARTMENT_OTHER)
Admission: RE | Admit: 2024-05-14 | Discharge: 2024-05-14 | Disposition: A | Discharge status: Home / Self Care | Source: Ambulatory Visit | Attending: Medical | Admitting: Medical

## 2024-05-14 ENCOUNTER — Ambulatory Visit: Payer: Self-pay | Admitting: Medical

## 2024-05-14 ENCOUNTER — Ambulatory Visit: Admitting: Medical

## 2024-05-14 VITALS — BP 120/84 | HR 87 | Temp 97.9°F | Resp 15 | Ht 75.0 in | Wt 264.0 lb

## 2024-05-14 DIAGNOSIS — M79644 Pain in right finger(s): Secondary | ICD-10-CM | POA: Insufficient documentation

## 2024-05-14 DIAGNOSIS — S6991XA Unspecified injury of right wrist, hand and finger(s), initial encounter: Secondary | ICD-10-CM | POA: Diagnosis not present

## 2024-05-14 NOTE — Patient Instructions (Addendum)
 Right fifth finger injury (possible fracture or contusion) Acute injury to right fifth finger at DIP joint with swelling and tenderness. Differential includes fracture or contusion. - Order x-ray of right fifth digit to assess for fracture. - Advise checking results on MyChart within 2-3 hours post-x-ray. - If no fracture, recommend buddy taping to adjacent finger for 3-5 days. - If fracture present, consider referral to sports medicine or hand specialist. - Discuss use of aluminum finger splints and Coban for support if needed. -if no fracture but if can't flex or extend digit completely let me know. presently appears no tendon injury. Full rom.  - Advise follow-up based on x-ray results.  Follow up date to be determined after xray review.

## 2024-05-14 NOTE — Telephone Encounter (Signed)
 FYI Only or Action Required?: FYI only for provider.  Patient was last seen in primary care on 11/24/2023 by Glenn Beverley NOVAK, MD.  Called Nurse Triage reporting Finger Injury.  Symptoms began yesterday.  Interventions attempted: Nothing.  Symptoms are: gradually worsening.  Triage Disposition: see HCP today  Patient/caregiver understands and will follow disposition?: yes Pt given appt at [facility]. Address and provided. No appts elsewhere.      Copied from CRM [id number]. Topic: Clinical - Red Word Triage >> May 14, 2024  9:18 AM Robinson H wrote: [facility] that prompted transfer to Nurse Triage: Right pinky finger swollen, red, and hurts to bend it hurt it last night in softball game Reason for Disposition  Finger swelling, bruise or pain  Answer Assessment - Initial Assessment Questions 1. MECHANISM: How did the injury happen?      Hit in hand by  softball 2. ONSET: When did the injury happen? (e.g., minutes, hours ago)      Last night 3. LOCATION: What part of the finger is injured? Is the nail damaged?      Right pinky finger  4. APPEARANCE of the INJURY: What does the injury look like?      Red swollen  5. SEVERITY: Can you use the hand normally?  Can you bend your fingers into a ball and then fully open them?     Yes janiece 6. SIZE: For cuts, bruises, or swelling, ask: How large is it? (e.g., inches or centimeters;  entire finger)      Entire finger 7. PAIN: Is there pain? If Yes, ask: How bad is the pain?  (Scale 0-10; or none, mild, moderate, severe)     4-5  8. TETANUS: For any breaks in the skin, ask: When was your last tetanus booster?     Jan 2025 9. OTHER SYMPTOMS: Do you have any other symptoms?     no 10. PREGNANCY: Is there any chance you are pregnant? When was your last menstrual period?       N/a  Protocols used: Finger Injury-A-AH

## 2024-05-14 NOTE — Telephone Encounter (Signed)
 Noted.  Patient seeing another provider today. Dm/cma

## 2024-05-14 NOTE — Progress Notes (Signed)
 Subjective:    Patient ID: Glenn Martinez, male    DOB: [birth year], 23 y.o.   MRN: [id number]  HPI  Glenn Martinez is a 22 year old male who presents with right fifth digit pain and swelling after a softball injury.  He experienced right pinky finger pain and swelling after being injured last night while playing softball. The injury occurred when a ball hit the outside of his right hand while he was playing catcher in a church league game.  He reports pain in his right pinky finger, particularly when bending it. It is not severe. He does not report pain at the end of the finger or in the middle, but notes swelling in the area.  He is concerned about a possible fracture due to a similar past experience with his elbow, which resulted in a fracture requiring surgery. He is right-handed.  No bruising is present, and he has not used any splints or buddy tape for the finger. He is not currently taking any medication for this issue.    Review of Systems  Constitutional:  Negative for chills, fatigue and fever.  Respiratory:  Negative for cough, chest tightness and wheezing.   Cardiovascular:  Negative for chest pain and palpitations.  Gastrointestinal:  Negative for abdominal pain, blood in stool and diarrhea.  Genitourinary:  Negative for dysuria.  Musculoskeletal:        Rt 5th digit pain. See hpi  Neurological:  Negative for dizziness, seizures, weakness and light-headedness.  Hematological:  Negative for adenopathy. Does not bruise/bleed easily.  Psychiatric/Behavioral:  Negative for behavioral problems and decreased concentration.      Past Medical History:  Diagnosis Date   Asthma    as a child     Social History   Socioeconomic History   Marital status: Single    Spouse name: Not on file   Number of children: 0   Years of education: Not on file   Highest education level: Bachelor's degree (e.g., BA, AB, BS)  Occupational History   Occupation: Psychologist, educational     Comment: [employer]  Tobacco Use   Smoking status: Never   Smokeless tobacco: Never  Vaping Use   Vaping status: Former  Substance and Sexual Activity   Alcohol use: No   Drug use: No   Sexual activity: Not on file  Other Topics Concern   Not on file  Social History Narrative   Not on file   Social Drivers of Health   Financial Resource Strain: Not on file  Food Insecurity: Not on file  Transportation Needs: Not on file  Physical Activity: Not on file  Stress: Not on file  Social Connections: Not on file  Intimate Partner Violence: Not on file    Past Surgical History:  Procedure Laterality Date   FRENULECTOMY, UPPER LABIAL     NASAL FRACTURE SURGERY     ORIF ULNAR FRACTURE Right 06/21/2017   Procedure: OPEN REDUCTION INTERNAL FIXATION (ORIF) proximal ulna;  Surgeon: Shari Easter, MD;  Location: [facility] OR;  Service: Orthopedics;  Laterality: Right;  90 mins   WISDOM TOOTH EXTRACTION      Family History  Problem Relation Age of Onset   Asthma Mother    Sarcoidosis Father        Neural   Stroke Paternal Uncle    Diabetes Paternal Uncle    Cancer Paternal Grandmother        Ovarian   Cancer Paternal Grandfather  Leukemia    Allergies  Allergen Reactions   Bee Venom Hives and Swelling    SWELLING REACTION UNSPECIFIED    Strawberry (Diagnostic) Hives and Itching   Doxycycline     Current Outpatient Medications on File Prior to Visit  Medication Sig Dispense Refill   acetaminophen (TYLENOL) 500 MG tablet Take 500-1,000 mg by mouth every 6 (six) hours as needed for moderate pain.     albuterol  (VENTOLIN  HFA) 108 (90 Base) MCG/ACT inhaler Inhale 2 puffs into the lungs every 6 (six) hours as needed for wheezing or shortness of breath. 8 g 0   ampicillin (PRINCIPEN) 500 MG capsule Take 500 mg by mouth 2 (two) times daily.     promethazine -dextromethorphan (PROMETHAZINE -DM) 6.25-15 MG/5ML syrup Take 5 mLs by mouth 4 (four) times daily as needed for cough. 118 mL 0    No current facility-administered medications on file prior to visit.    BP 120/84   Pulse 87   Temp 97.9 F (36.6 C) (Oral)   Resp 15   Ht 6' 3 (1.905 m)   Wt 264 lb (119.7 kg)   SpO2 99%   BMI 33.00 kg/m          Objective:   Physical Exam  General- no acute distress. Rt hand- 5th digit dip area swollen and tender. Full flexion and extension. Swollen compared to left side. Nail looks normal. No bruising.      Assessment & Plan:   Patient Instructions  Right fifth finger injury (possible fracture or contusion) Acute injury to right fifth finger at DIP joint with swelling and tenderness. Differential includes fracture or contusion. - Order x-ray of right fifth digit to assess for fracture. - Advise checking results on MyChart within 2-3 hours post-x-ray. - If no fracture, recommend buddy taping to adjacent finger for 3-5 days. - If fracture present, consider referral to sports medicine or hand specialist. - Discuss use of aluminum finger splints and Coban for support if needed. -if no fracture but if can't flex or extend digit completely let me know. presently appears no tendon injury - Advise follow-up based on x-ray results.  Follow up date to be determined after xray review.

## 2024-07-05 ENCOUNTER — Emergency Department (HOSPITAL_COMMUNITY)
Admission: EM | Admit: 2024-07-05 | Discharge: 2024-07-05 | Disposition: A | Discharge status: Home / Self Care | Attending: Emergency Medicine | Admitting: Emergency Medicine

## 2024-07-05 ENCOUNTER — Emergency Department (HOSPITAL_COMMUNITY)

## 2024-07-05 DIAGNOSIS — S39012A Strain of muscle, fascia and tendon of lower back, initial encounter: Secondary | ICD-10-CM | POA: Diagnosis not present

## 2024-07-05 DIAGNOSIS — Y92410 Unspecified street and highway as the place of occurrence of the external cause: Secondary | ICD-10-CM | POA: Diagnosis not present

## 2024-07-05 DIAGNOSIS — S3992XA Unspecified injury of lower back, initial encounter: Secondary | ICD-10-CM | POA: Diagnosis not present

## 2024-07-05 DIAGNOSIS — Z041 Encounter for examination and observation following transport accident: Secondary | ICD-10-CM | POA: Diagnosis not present

## 2024-07-05 MED ORDER — NAPROXEN 500 MG PO TABS: 500.0000 mg | ORAL_TABLET | Freq: Two times a day (BID) | ORAL | 0 refills | Status: DC

## 2024-07-05 MED ORDER — NAPROXEN 250 MG PO TABS
500.0000 mg | ORAL_TABLET | Freq: Once | ORAL | Status: AC
  Administered 2024-07-05: 500 mg via ORAL
  Filled 2024-07-05: qty 2

## 2024-07-05 MED ORDER — CYCLOBENZAPRINE HCL 5 MG PO TABS: 5.0000 mg | ORAL_TABLET | Freq: Three times a day (TID) | ORAL | 0 refills | Status: AC | PRN

## 2024-07-05 MED ORDER — CYCLOBENZAPRINE HCL 10 MG PO TABS
5.0000 mg | ORAL_TABLET | Freq: Once | ORAL | Status: AC
  Administered 2024-07-05: 5 mg via ORAL
  Filled 2024-07-05: qty 1

## 2024-07-05 NOTE — Discharge Instructions (Addendum)
 You were seen today for low back pain. While you were here we monitored your vitals, performed a physical exam, and x-rays. These were all reassuring and there is no indication for any further testing or intervention in the emergency department at this time.   Things to do:  - Follow up with your primary care provider within the next 1-2 weeks - Take Flexeril 3 times a day as needed for muscle pain - Take Naproxen 2 times a day as needed for pain, do not take any Advil, Aleve, or Goody's powder while taking this medication  Return to the emergency department if you have any new or worsening symptoms including numbness or tingling in your legs, weakness, difficulty ambulating, new abdominal pain, or if you have any other concerns.

## 2024-07-05 NOTE — ED Notes (Signed)
 Patient transported to X-ray; No changes
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
## 2024-07-10 ENCOUNTER — Ambulatory Visit: Payer: Self-pay
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
## 2024-07-11 ENCOUNTER — Ambulatory Visit: Admitting: Nurse Practitioner

## 2024-07-11 ENCOUNTER — Ambulatory Visit: Payer: Self-pay | Admitting: Nurse Practitioner

## 2024-07-11 ENCOUNTER — Encounter: Payer: Self-pay | Admitting: Nurse Practitioner

## 2024-07-11 ENCOUNTER — Ambulatory Visit (INDEPENDENT_AMBULATORY_CARE_PROVIDER_SITE_OTHER)

## 2024-07-11 VITALS — BP 122/82 | HR 85 | Temp 97.4°F | Ht 75.0 in | Wt 266.4 lb

## 2024-07-11 DIAGNOSIS — M542 Cervicalgia: Secondary | ICD-10-CM

## 2024-07-11 DIAGNOSIS — M546 Pain in thoracic spine: Secondary | ICD-10-CM

## 2024-07-11 DIAGNOSIS — M438X4 Other specified deforming dorsopathies, thoracic region: Secondary | ICD-10-CM | POA: Diagnosis not present

## 2024-07-11 MED ORDER — NAPROXEN 500 MG PO TABS: 500.0000 mg | ORAL_TABLET | Freq: Two times a day (BID) | ORAL | 0 refills | Status: AC

## 2024-07-11 MED ORDER — LIDOCAINE 5 % EX PTCH: 1.0000 | MEDICATED_PATCH | CUTANEOUS | 0 refills | Status: AC

## 2024-07-11 MED ORDER — CYCLOBENZAPRINE HCL 10 MG PO TABS: 10.0000 mg | ORAL_TABLET | Freq: Three times a day (TID) | ORAL | 0 refills | Status: AC | PRN

## 2024-07-11 NOTE — Patient Instructions (Signed)
 It was great to see you!  Let's get xrays of your upper back and neck   Re-start naproxen twice a day with food   You can take flexeril at night as needed for muscle tightness   Start lidocaine  patch on your back during the day  I am placing a referral to PT   Let's follow-up if symptoms worsen or don't improve   Take care,  Tinnie Harada, NP
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
## 2024-07-11 NOTE — Assessment & Plan Note (Signed)
 Acute midline thoracic spine pain described as burning and constant. Will updated xrays of thoracic spine. Continue naproxen 500mg  BID with food and start lidocaine  patch 5% daily. Can use flexeril 10mg  TID prn muscle spasms/tightness - this can cause sedation. Avoid lifting more than 10 pounds for 2 weeks. Referral placed to PT and ortho. Can continue heat and ice. Follow-up if symptoms worsen or any concerns.

## 2024-07-11 NOTE — Telephone Encounter (Signed)
 Noted. Appointment with Tinnie on 07/11/24.

## 2024-07-31 ENCOUNTER — Telehealth: Payer: Self-pay | Admitting: Physical Therapy

## 2024-08-01 ENCOUNTER — Ambulatory Visit: Admitting: Physical Therapy

## 2024-08-01 ENCOUNTER — Telehealth: Payer: Self-pay | Admitting: Family Medicine

## 2024-08-01 NOTE — Telephone Encounter (Signed)
 Copied from CRM [id number]. Topic: Referral - Status >> Aug 01, 2024  8:18 AM Glenn Martinez wrote: Reason for CRM: Patient called requesting to change his physical therapy referral because the location that was provided cost too much out of pocket and he would like another location such as: [facility].

## 2024-08-07 DIAGNOSIS — M545 Low back pain, unspecified: Secondary | ICD-10-CM | POA: Diagnosis not present

## 2024-08-07 DIAGNOSIS — M542 Cervicalgia: Secondary | ICD-10-CM | POA: Diagnosis not present

## 2024-08-07 DIAGNOSIS — M6281 Muscle weakness (generalized): Secondary | ICD-10-CM | POA: Diagnosis not present

## 2024-08-14 DIAGNOSIS — M6281 Muscle weakness (generalized): Secondary | ICD-10-CM | POA: Diagnosis not present

## 2024-08-14 DIAGNOSIS — M545 Low back pain, unspecified: Secondary | ICD-10-CM | POA: Diagnosis not present

## 2024-08-14 DIAGNOSIS — M542 Cervicalgia: Secondary | ICD-10-CM | POA: Diagnosis not present

## 2024-08-27 ENCOUNTER — Other Ambulatory Visit (HOSPITAL_COMMUNITY): Payer: Self-pay

## 2024-08-27 ENCOUNTER — Telehealth: Payer: Self-pay

## 2024-08-27 NOTE — Telephone Encounter (Signed)
 Pharmacy Patient Advocate Encounter  Received notification from [facility] that Prior Authorization for Lidocaine  5% patches  has been DENIED.  Full denial letter will be uploaded to the media tab. See denial reason below.   PA #/Case ID/Reference #: [id number]

## 2024-08-27 NOTE — Telephone Encounter (Signed)
 Pharmacy Patient Advocate Encounter   Received notification from Onbase that prior authorization for Lidocaine  5% patches is required/requested.   Insurance verification completed.   The patient is insured through [facility].   Per test claim: PA required; PA submitted to above mentioned insurance via Latent Key/confirmation #/EOC [id number] Status is pending

## 2024-08-28 DIAGNOSIS — M6281 Muscle weakness (generalized): Secondary | ICD-10-CM | POA: Diagnosis not present

## 2024-08-28 DIAGNOSIS — M545 Low back pain, unspecified: Secondary | ICD-10-CM | POA: Diagnosis not present

## 2024-08-28 DIAGNOSIS — M542 Cervicalgia: Secondary | ICD-10-CM | POA: Diagnosis not present

## 2024-08-30 NOTE — Telephone Encounter (Signed)
Left VM to rtn call. Dm/cma       

## 2024-09-02 NOTE — Telephone Encounter (Signed)
 Called and spoke to patient, he has just been using heating pad and doing PT.  He will pick up some OTC lidocaine  patches if he needs them and then call to schedule an appoint if not getting better,. Dm/cma

## 2024-09-18 ENCOUNTER — Telehealth: Payer: Self-pay

## 2024-09-18 NOTE — Telephone Encounter (Signed)
 Copied from CRM [id number]. Topic: Clinical - Medical Advice >> Sep 18, 2024 12:20 PM Deaijah H wrote: Reason for CRM: Patient called in stating girlfriend tested positive for COVID & would like to know if it's possible to get tested now or wait until he's having symptoms. Stated he works around public so he's curious. Please call.

## 2024-09-20 ENCOUNTER — Ambulatory Visit: Admitting: Internal Medicine

## 2024-09-20 VITALS — BP 130/68 | HR 62 | Temp 98.9°F | Ht 74.0 in | Wt 266.0 lb

## 2024-09-20 DIAGNOSIS — Z91030 Bee allergy status: Secondary | ICD-10-CM

## 2024-09-20 DIAGNOSIS — J069 Acute upper respiratory infection, unspecified: Secondary | ICD-10-CM

## 2024-09-20 LAB — POC COVID19 BINAXNOW: SARS Coronavirus 2 Ag: NEGATIVE

## 2024-09-20 LAB — POCT INFLUENZA A/B
Influenza A, POC: NEGATIVE
Influenza B, POC: NEGATIVE

## 2024-09-20 LAB — POCT RAPID STREP A (OFFICE): Rapid Strep A Screen: NEGATIVE

## 2024-09-20 MED ORDER — EPINEPHRINE 0.3 MG/0.3ML IJ SOAJ: 0.3000 mg | INTRAMUSCULAR | 1 refills | Status: AC | PRN

## 2024-09-20 MED ORDER — AZITHROMYCIN 250 MG PO TABS: ORAL_TABLET | ORAL | 0 refills | Status: AC

## 2024-09-20 NOTE — Progress Notes (Signed)
 " [facility] [address] Glenn Martinez [address] Dept: [phone] Dept Fax: [phone]  Acute Care Office Visit  Subjective:   Glenn Martinez November 06, 2000 09/20/2024  Chief Complaint  Patient presents with   Acute Visit    Patient is her for an acute visit. Stuff & runny nose started Wednesday in [place] at wedding tiredness, body aches, gf tested positive for covid took covid test and they were negative.    HPI:  Discussed the use of AI scribe software for clinical note transcription with the patient, who gave verbal consent to proceed.  History of Present Illness   Glenn Martinez is a 24 year old male who presents with symptoms following exposure to COVID-19.  He traveled to [place] last weekend for a wedding and was around many people. He returned to [facility] on Monday night after spending time with his girlfriend, who later tested positive for COVID-19 on Tuesday.  On Wednesday, he began experiencing fatigue and mild myalgia, initially attributing it to the trip. By Thursday, he developed a headache and left work early to rest, sleeping for three to four hours, which provided some relief. By Thursday night, he developed nasal congestion and rhinorrhea, and by today, he experienced a runny and stuffy nose along with intermittent sneezing.  He took an at-home COVID/FLU test on Thursday morning and again today, both of which were negative. He is concerned about the accuracy of these tests due to his close proximity to others at work.  No fever, cough, chest pain, or dyspnea. The myalgia has resolved, but he still experiences intermittent headaches, which he manages with Advil or ibuprofen. He reports sinus congestion and throat drainage, causing discomfort when swallowing. He has not taken any other medications for his symptoms.  He has a history of allergies to bees and strawberries and recalls needing an EpiPen in the past due  to a severe reaction to a bee sting. He has not had an EpiPen since childhood and is concerned about needing one due to his outdoor activities.       The following portions of the patient's history were reviewed and updated as appropriate: past medical history, past surgical history, family history, social history, allergies, medications, and problem list.   Patient Active Problem List   Diagnosis Date Noted   Acute midline thoracic back pain 07/11/2024   Past Medical History:  Diagnosis Date   Asthma    as a child   Past Surgical History:  Procedure Laterality Date   FRENULECTOMY, UPPER LABIAL     NASAL FRACTURE SURGERY     ORIF ULNAR FRACTURE Right 06/21/2017   Procedure: OPEN REDUCTION INTERNAL FIXATION (ORIF) proximal ulna;  Surgeon: Shari Easter, MD;  Location: [facility] OR;  Service: Orthopedics;  Laterality: Right;  90 mins   WISDOM TOOTH EXTRACTION     Family History  Problem Relation Age of Onset   Asthma Mother    Sarcoidosis Father        Neural   Stroke Paternal Uncle    Diabetes Paternal Uncle    Cancer Paternal Grandmother        Ovarian   Cancer Paternal Grandfather        Leukemia   Current Medications[1] Allergies[2]   ROS: A complete ROS was performed with pertinent positives/negatives noted in the HPI. The remainder of the ROS are negative.    Objective:   Today's Vitals   09/20/24 0918  BP: 130/68  Pulse:  62  Temp: 98.9 F (37.2 C)  SpO2: 98%  Weight: 266 lb (120.7 kg)  Height: 6' 2 (1.88 m)  PainSc: 0-No pain    GENERAL: Well-appearing, in NAD. Well nourished.  SKIN: Pink, warm and dry. No rash.  HEENT:    HEAD: Normocephalic, non-traumatic.  EYES: Conjunctive pink without exudate. EARS: External ear w/o redness, swelling, masses, or lesions. EAC clear. TM's intact, translucent w/o bulging, appropriate landmarks visualized.  NOSE: Septum midline w/o deformity. Nares patent, mucosa pink, turbinates swollen and inflamed with clear-white  drainage. No sinus tenderness.  THROAT: Uvula midline. Oropharynx slight erythema. Tonsils non-inflamed w/o exudate. Mucus membranes pink and moist.  NECK: Trachea midline. Full ROM w/o pain or tenderness. No lymphadenopathy.  RESPIRATORY: Chest wall symmetrical. Respirations even and non-labored. Breath sounds clear to auscultation bilaterally.  CARDIAC: S1, S2 present, regular rate and rhythm. Peripheral pulses 2+ bilaterally.  EXTREMITIES: Without clubbing, cyanosis, or edema.  NEUROLOGIC: Steady, even gait.  PSYCH/MENTAL STATUS: Alert, oriented x 3. Cooperative, appropriate mood and affect.    Results for orders placed or performed in visit on 09/20/24  POCT Influenza A/B  Result Value Ref Range   Influenza A, POC Negative Negative   Influenza B, POC Negative Negative  POC COVID-19 BinaxNow  Result Value Ref Range   SARS Coronavirus 2 Ag Negative Negative  POCT rapid strep A  Result Value Ref Range   Rapid Strep A Screen Negative Negative      Assessment & Plan:  Assessment and Plan    Acute upper respiratory infection Symptoms suggest viral upper respiratory infection or COVID-19. Negative COVID tests but false negatives possible. Risk of bacterial complications. - Advised rest and increased fluid intake. - Recommended over-the-counter decongestants like Sudafed and Mucinex DM. - OTC tylenol/ibuprofen for body aches/ headache - Prescribed azithromycin  to start if symptoms worsen or persist by Monday. - Instructed to return if chest pain, shortness of breath, or difficulty breathing occur.  Bee sting allergy - Prescribed EpiPen, dispense two with one refill.     Meds ordered this encounter  Medications   EPINEPHrine 0.3 mg/0.3 mL IJ SOAJ injection    Sig: Inject 0.3 mg into the muscle as needed for anaphylaxis.    Dispense:  2 each    Refill:  1    Supervising Provider:   THOMPSON, AARON B [[id number]]   azithromycin  (ZITHROMAX ) 250 MG tablet    Sig: Take 2 tablets on  day 1, then 1 tablet daily on days 2 through 5    Dispense:  6 tablet    Refill:  0    Supervising Provider:   SEBASTIAN BEVERLEY NOVAK [[id number]]   Orders Placed This Encounter  Procedures   POCT Influenza A/B   POC COVID-19 BinaxNow   POCT rapid strep A   Lab Orders         POCT Influenza A/B         POC COVID-19 BinaxNow         POCT rapid strep A     No images are attached to the encounter or orders placed in the encounter.  Return if symptoms worsen or fail to improve.   Rosina Senters, FNP     [1]  Current Outpatient Medications:    acetaminophen (TYLENOL) 500 MG tablet, Take 500-1,000 mg by mouth every 6 (six) hours as needed for moderate pain., Disp: , Rfl:    albuterol  (VENTOLIN  HFA) 108 (90 Base) MCG/ACT inhaler, Inhale 2 puffs into the lungs every  6 (six) hours as needed for wheezing or shortness of breath., Disp: 8 g, Rfl: 0   azithromycin  (ZITHROMAX ) 250 MG tablet, Take 2 tablets on day 1, then 1 tablet daily on days 2 through 5, Disp: 6 tablet, Rfl: 0   EPINEPHrine 0.3 mg/0.3 mL IJ SOAJ injection, Inject 0.3 mg into the muscle as needed for anaphylaxis., Disp: 2 each, Rfl: 1   cyclobenzaprine  (FLEXERIL ) 10 MG tablet, Take 1 tablet (10 mg total) by mouth 3 (three) times daily as needed for muscle spasms. (Patient not taking: Reported on 09/20/2024), Disp: 30 tablet, Rfl: 0   lidocaine  (LIDODERM ) 5 %, Place 1 patch onto the skin daily. Remove & Discard patch within 12 hours or as directed by MD (Patient not taking: Reported on 09/20/2024), Disp: 30 patch, Rfl: 0 [2]  Allergies Allergen Reactions   Bee Venom Hives and Swelling    SWELLING REACTION UNSPECIFIED    Strawberry (Diagnostic) Hives and Itching   Doxycycline    "

## 2024-09-20 NOTE — Patient Instructions (Signed)
 Rest, drink plenty of fluids.  Tylenol for fever, body aches.   For cough: Take Mucinex DM or Robitussin-DM over-the-counter.  Follow the instructions in the box.  For sore throat:  Use over-the-counter throat lozenges as needed  Use over-the-counter chloraseptic spray as needed  Warm salt water gargles  Tylenol: take as directed on packaging.   For nasal and sinus congestion: Use over-the-counter Flonase: 2 nasal sprays on each side of the nose in the morning until you feel better  Use over-the-counter Astepro 2 nasal sprays on each side of the nose twice daily until better  Patients without high blood pressure: can use decongestants such as pseudoephedrine or phenylephrine no more than 3-5 days.   Patients with high blood pressure can use Coricidin HBP for decongestant, it will not raise your blood pressure.     If you have been prescribed an antibiotic, take as prescribed.  Call if not gradually better over the next  10 days  Call anytime if the symptoms are severe, you have high fever, short of breath, chest pain
